# Patient Record
Sex: Male | Born: 1990 | Race: Black or African American | Hispanic: No | Marital: Married | Smoking: Current every day smoker
Health system: Southern US, Community
[De-identification: ages and names within clinical notes are randomized; demographics above are authoritative.]

---
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
## 2009-04-13 ENCOUNTER — Emergency Department (HOSPITAL_COMMUNITY): Admission: EM | Admit: 2009-04-13 | Discharge: 2009-04-13 | Payer: Self-pay | Admitting: Emergency Medicine

## 2012-05-19 ENCOUNTER — Encounter (HOSPITAL_COMMUNITY): Payer: Self-pay | Admitting: *Deleted

## 2012-05-19 ENCOUNTER — Emergency Department (HOSPITAL_COMMUNITY)
Admission: EM | Admit: 2012-05-19 | Discharge: 2012-05-19 | Disposition: A | Discharge status: Home / Self Care | Payer: Self-pay | Attending: Emergency Medicine | Admitting: Emergency Medicine

## 2012-05-19 DIAGNOSIS — F17200 Nicotine dependence, unspecified, uncomplicated: Secondary | ICD-10-CM | POA: Insufficient documentation

## 2012-05-19 DIAGNOSIS — F4320 Adjustment disorder, unspecified: Secondary | ICD-10-CM | POA: Insufficient documentation

## 2012-05-19 LAB — CBC
HCT: 44.8 % (ref 39.0–52.0)
Hemoglobin: 15.1 g/dL (ref 13.0–17.0)
MCH: 25.5 pg — ABNORMAL LOW (ref 26.0–34.0)
MCHC: 33.7 g/dL (ref 30.0–36.0)
RDW: 15.2 % (ref 11.5–15.5)

## 2012-05-19 LAB — COMPREHENSIVE METABOLIC PANEL
Albumin: 4.7 g/dL (ref 3.5–5.2)
BUN: 7 mg/dL (ref 6–23)
Calcium: 9.9 mg/dL (ref 8.4–10.5)
Creatinine, Ser: 0.95 mg/dL (ref 0.50–1.35)
GFR calc Af Amer: >90 mL/min (ref >90)
Glucose, Bld: 92 mg/dL (ref 70–99)
Total Protein: 8 g/dL (ref 6.0–8.3)

## 2012-05-19 LAB — RAPID URINE DRUG SCREEN, HOSP PERFORMED
Benzodiazepines: NOT DETECTED
Cocaine: NOT DETECTED
Opiates: NOT DETECTED

## 2012-05-19 LAB — ETHANOL: Alcohol, Ethyl (B): <11 mg/dL (ref 0–11)

## 2012-05-19 MED ORDER — NICOTINE 21 MG/24HR TD PT24
21.0000 mg | MEDICATED_PATCH | Freq: Every day | TRANSDERMAL | Status: DC
  Administered 2012-05-19: 21 mg via TRANSDERMAL
  Filled 2012-05-19: qty 1

## 2012-05-19 MED ORDER — IBUPROFEN 600 MG PO TABS: 600.0000 mg | ORAL_TABLET | Freq: Three times a day (TID) | ORAL | Status: DC | PRN

## 2012-05-19 MED ORDER — ACETAMINOPHEN 325 MG PO TABS: 650.0000 mg | ORAL_TABLET | ORAL | Status: DC | PRN

## 2012-05-19 MED ORDER — ONDANSETRON HCL 4 MG PO TABS: 4.0000 mg | ORAL_TABLET | Freq: Three times a day (TID) | ORAL | Status: DC | PRN

## 2012-05-19 MED ORDER — ALUM & MAG HYDROXIDE-SIMETH 200-200-20 MG/5ML PO SUSP: 30.0000 mL | ORAL | Status: DC | PRN

## 2012-05-19 NOTE — ED Notes (Signed)
Pt. Oriented to psych ed.  Pt. Calmly talking on phone at present.

## 2012-05-19 NOTE — ED Notes (Addendum)
Pt is fasting, no meals given

## 2012-05-19 NOTE — ED Provider Notes (Signed)
History     CSN: [id number]  Arrival date & time 05/19/12  1453   First MD Initiated Contact with Patient 05/19/12 1540      Chief Complaint  Patient presents with  . Medical Clearance    (Consider location/radiation/quality/duration/timing/severity/associated sxs/prior treatment) HPI  Patient presents to emergency department by [employer] with IVC paperwork from his parole officer's office with concern from [facility] and the parole officer that the patient made multiple statements of suicidal ideation. Initially, [employer] was called to the patient's house by himself with the call log noting that patient may claims that he had a lot going on at home and that he had another legal dispute and that he was upset about not wanting to go back to general and endocrine per the call log that he would jump in front of a car." When [employer] arrived on scene they state that patient was more calm and cooperative and they took him voluntarily to [facility] to be evaluated. Patient quickly left [facility] with the nurse who evaluated stating that she did not believe that he actually would act on any of his claims per the police report. Shortly after the patient voluntarily left [facility] the police were then called to the parole officers office with him stating that they had a man in restraints. Please that had been called first to the patient's home arrived on scene and found the patient in restraints by the parole officer. The parole officer reports that A. she became very aggressive and angry about his additional legal concerns as well as court dates and once again may claims that he did not go back to general and that he would hurt himself to prevent going back. Patient is a difficult historian because he denies these allegations is very elusive in explaining what happened. He denies suicidal or homicidal ideation. He states "I may have said that once but I didn't mean it." He has no physical complaint. Patient states his no known medical  problems and takes no medicines on regular basis. He denies any history of psychiatric complaints or psychiatric hospitalizations. History reviewed. No pertinent past medical history.  History reviewed. No pertinent past surgical history.  No family history on file.  History  Substance Use Topics  . Smoking status: Current Everyday Smoker  . Smokeless tobacco: Not on file  . Alcohol Use: Yes     1can beer/day      Review of Systems  All other systems reviewed and are negative.    Allergies  Review of patient's allergies indicates no known allergies.  Home Medications  No current outpatient prescriptions on file.  BP 124/63  Pulse 77  Temp 98 F (36.7 C) (Oral)  Resp 16  SpO2 99%  Physical Exam  Nursing note and vitals reviewed. Constitutional: He is oriented to person, place, and time. He appears well-developed and well-nourished. No distress.  HENT:  Head: Normocephalic and atraumatic.  Eyes: Conjunctivae are normal.  Neck: Normal range of motion. Neck supple.  Cardiovascular: Normal rate, regular rhythm, normal heart sounds and intact distal pulses.  Exam reveals no gallop and no friction rub.   No murmur heard. Pulmonary/Chest: Effort normal and breath sounds normal. No respiratory distress. He has no wheezes. He has no rales. He exhibits no tenderness.  Abdominal: Bowel sounds are normal. He exhibits no distension and no mass. There is no tenderness. There is no rebound and no guarding.  Musculoskeletal: Normal range of motion. He exhibits no edema and no tenderness.  Neurological: He is alert and oriented to person, place, and time.  Skin: Skin is warm and dry. No rash noted. He is not diaphoretic. No erythema.  Psychiatric: He has a normal mood and affect. His speech is normal and behavior is normal. Judgment and thought content normal. He expresses no homicidal and no suicidal ideation. He expresses no suicidal plans and no homicidal plans.    ED Course    Procedures (including critical care time)  Spoke with ACT who will evaluate patient after Telepsych recommendations.   I believe that patient's outbursts were likely out of anger though numerous sources have now reported some type of SI statement about "not going back to jail". Though I believe he will not act on these and he has denied SI or HI throughout ER stay, I will consult telepsych.   Temp psych holding orders written.   Labs Reviewed  CBC - Abnormal; Notable for the following:    RBC 5.92 (*)     MCV 75.7 (*)     MCH 25.5 (*)     All other components within normal limits  COMPREHENSIVE METABOLIC PANEL - Abnormal; Notable for the following:    Potassium 3.4 (*)     All other components within normal limits  URINE RAPID DRUG SCREEN (HOSP PERFORMED) - Abnormal; Notable for the following:    Tetrahydrocannabinol POSITIVE (*)     All other components within normal limits  ETHANOL   No results found.   1. Adjustment disorder       MDM  dispo pending tele psych consult.     Tele psych consult in chart. They rescended IVC stating patient was impulsive with adjustment disorder but no concern for active SI and safe to d/c home.     [place], [place] 05/19/12 [address] [place], [place] 05/19/12 2156

## 2012-05-19 NOTE — Progress Notes (Signed)
Pt was seen by the tele-psychiatrist who recommends discharge home with outpatient resources. Tele-psych also reversed the pt's IVC papers. ED PA notified and is in agreement with disposition. RN made aware. CSW met with the pt to provide information on outpatient resources including [facility] LME, mobile crisis, and local psychiatrists/therapists. Pt was able to contract for safety.  No further needs were identified at this time.

## 2012-05-19 NOTE — ED Notes (Signed)
Patient discharge to home with written and verbal instructions with a steady gait. Respirations equal and unlabored, Skin warm and dry. No acute distress noted.

## 2012-05-19 NOTE — ED Notes (Signed)
Telepsych consult ordered. 

## 2012-05-19 NOTE — ED Notes (Signed)
Pt in by [employer]. Per IVC papers, pt repeatedly told probation officer that he was suicidal today. Was aggressive towards probation staff. When [employer] called, pt had to be physically restrained. Pt arrives to ED in handcuffs. Pt denying SI/HI. Vague and noncompliant with questioning. Responds with "sometimes" to some questions and mumbling then answering "nevermind" when asked again. Pt denies med or psych Hx. Denies meds.

## 2012-05-19 NOTE — ED Notes (Signed)
Pt states he received a ticket today and went to his probation officer today to let her know.  While there she advised him that if he were found guilty he would have to pay fines and have his probation extended.  Pt then said that he would not pay fines or have his probation extended, he would just do something else.  When asked, he denied SI/HI.  Stated he was angry when talking to her.  Pleasant during assessment.

## 2012-05-20 NOTE — ED Provider Notes (Signed)
Medical screening examination/treatment/procedure(s) were performed by non-physician practitioner and as supervising physician I was immediately available for consultation/collaboration.  Cyndra Numbers, MD 05/20/12 [phone]

## 2019-08-03 DIAGNOSIS — N179 Acute kidney failure, unspecified: Secondary | ICD-10-CM | POA: Insufficient documentation

## 2019-08-03 DIAGNOSIS — Z20828 Contact with and (suspected) exposure to other viral communicable diseases: Secondary | ICD-10-CM | POA: Insufficient documentation

## 2019-08-03 DIAGNOSIS — E872 Acidosis: Secondary | ICD-10-CM | POA: Insufficient documentation

## 2019-08-03 DIAGNOSIS — R451 Restlessness and agitation: Secondary | ICD-10-CM | POA: Insufficient documentation

## 2019-08-03 DIAGNOSIS — F17200 Nicotine dependence, unspecified, uncomplicated: Secondary | ICD-10-CM | POA: Insufficient documentation

## 2019-08-03 DIAGNOSIS — E876 Hypokalemia: Secondary | ICD-10-CM | POA: Insufficient documentation

## 2019-08-03 DIAGNOSIS — M6282 Rhabdomyolysis: Principal | ICD-10-CM | POA: Insufficient documentation

## 2019-08-03 DIAGNOSIS — Z5321 Procedure and treatment not carried out due to patient leaving prior to being seen by health care provider: Secondary | ICD-10-CM | POA: Insufficient documentation

## 2019-08-03 DIAGNOSIS — F10129 Alcohol abuse with intoxication, unspecified: Secondary | ICD-10-CM | POA: Insufficient documentation

## 2019-08-04 ENCOUNTER — Observation Stay (HOSPITAL_COMMUNITY)
Admission: EM | Admit: 2019-08-04 | Discharge: 2019-08-04 | Discharge status: Against Medical Advice | Payer: Self-pay | Attending: Internal Medicine | Admitting: Internal Medicine

## 2019-08-04 ENCOUNTER — Emergency Department (HOSPITAL_COMMUNITY): Payer: Self-pay

## 2019-08-04 ENCOUNTER — Other Ambulatory Visit: Payer: Self-pay

## 2019-08-04 DIAGNOSIS — E872 Acidosis, unspecified: Secondary | ICD-10-CM | POA: Diagnosis present

## 2019-08-04 DIAGNOSIS — F10920 Alcohol use, unspecified with intoxication, uncomplicated: Secondary | ICD-10-CM

## 2019-08-04 DIAGNOSIS — M6282 Rhabdomyolysis: Secondary | ICD-10-CM

## 2019-08-04 DIAGNOSIS — F10929 Alcohol use, unspecified with intoxication, unspecified: Secondary | ICD-10-CM | POA: Diagnosis present

## 2019-08-04 DIAGNOSIS — R4689 Other symptoms and signs involving appearance and behavior: Secondary | ICD-10-CM

## 2019-08-04 DIAGNOSIS — E876 Hypokalemia: Secondary | ICD-10-CM | POA: Diagnosis present

## 2019-08-04 LAB — COMPREHENSIVE METABOLIC PANEL
ALT: 30 U/L (ref 0–44)
AST: 39 U/L (ref 15–41)
Albumin: 4.9 g/dL (ref 3.5–5.0)
Alkaline Phosphatase: 75 U/L (ref 38–126)
Anion gap: 19 — ABNORMAL HIGH (ref 5–15)
BUN: 14 mg/dL (ref 6–20)
CO2: 18 mmol/L — ABNORMAL LOW (ref 22–32)
Calcium: 8.9 mg/dL (ref 8.9–10.3)
Chloride: 103 mmol/L (ref 98–111)
Creatinine, Ser: 1.38 mg/dL — ABNORMAL HIGH (ref 0.61–1.24)
GFR calc Af Amer: >60 mL/min (ref >60)
GFR calc non Af Amer: >60 mL/min (ref >60)
Glucose, Bld: 91 mg/dL (ref 70–99)
Potassium: 3.4 mmol/L — ABNORMAL LOW (ref 3.5–5.1)
Sodium: 140 mmol/L (ref 135–145)
Total Bilirubin: 0.6 mg/dL (ref 0.3–1.2)
Total Protein: 8 g/dL (ref 6.5–8.1)

## 2019-08-04 LAB — CBC
HCT: 44.1 % (ref 39.0–52.0)
Hemoglobin: 14.1 g/dL (ref 13.0–17.0)
MCH: 25.7 pg — ABNORMAL LOW (ref 26.0–34.0)
MCHC: 32 g/dL (ref 30.0–36.0)
MCV: 80.3 fL (ref 80.0–100.0)
Platelets: 301 10*3/uL (ref 150–400)
RBC: 5.49 MIL/uL (ref 4.22–5.81)
RDW: 15.3 % (ref 11.5–15.5)
WBC: 7.7 10*3/uL (ref 4.0–10.5)
nRBC: 0 % (ref 0.0–0.2)

## 2019-08-04 LAB — RAPID URINE DRUG SCREEN, HOSP PERFORMED
Amphetamines: NOT DETECTED
Barbiturates: NOT DETECTED
Benzodiazepines: POSITIVE — AB
Cocaine: NOT DETECTED
Opiates: NOT DETECTED
Tetrahydrocannabinol: POSITIVE — AB

## 2019-08-04 LAB — URINALYSIS, COMPLETE (UACMP) WITH MICROSCOPIC
Bilirubin Urine: NEGATIVE
Glucose, UA: NEGATIVE mg/dL
Ketones, ur: NEGATIVE mg/dL
Leukocytes,Ua: NEGATIVE
Nitrite: NEGATIVE
Protein, ur: 100 mg/dL — AB
Specific Gravity, Urine: 1.01 (ref 1.005–1.030)
pH: 6 (ref 5.0–8.0)

## 2019-08-04 LAB — ACETAMINOPHEN LEVEL: Acetaminophen (Tylenol), Serum: <10 ug/mL — ABNORMAL LOW (ref 10–30)

## 2019-08-04 LAB — CK
Total CK: 1421 U/L — ABNORMAL HIGH (ref 49–397)
Total CK: 3998 U/L — ABNORMAL HIGH (ref 49–397)

## 2019-08-04 LAB — ETHANOL: Alcohol, Ethyl (B): 211 mg/dL — ABNORMAL HIGH (ref <10)

## 2019-08-04 LAB — SALICYLATE LEVEL: Salicylate Lvl: <7 mg/dL (ref 2.8–30.0)

## 2019-08-04 LAB — SARS CORONAVIRUS 2 (TAT 6-24 HRS): SARS Coronavirus 2: NEGATIVE

## 2019-08-04 LAB — CBG MONITORING, ED: Glucose-Capillary: 89 mg/dL (ref 70–99)

## 2019-08-04 MED ORDER — HEPARIN SODIUM (PORCINE) 5000 UNIT/ML IJ SOLN: 5000.0000 [IU] | Freq: Three times a day (TID) | INTRAMUSCULAR | Status: DC

## 2019-08-04 MED ORDER — ZOLPIDEM TARTRATE 5 MG PO TABS: 5.0000 mg | ORAL_TABLET | Freq: Every evening | ORAL | Status: DC | PRN

## 2019-08-04 MED ORDER — SODIUM CHLORIDE 0.9 % IV BOLUS (SEPSIS)
1000.0000 mL | Freq: Once | INTRAVENOUS | Status: AC
  Administered 2019-08-04: 1000 mL via INTRAVENOUS

## 2019-08-04 MED ORDER — ACETAMINOPHEN 325 MG PO TABS: 650.0000 mg | ORAL_TABLET | Freq: Four times a day (QID) | ORAL | Status: DC | PRN

## 2019-08-04 MED ORDER — SENNOSIDES-DOCUSATE SODIUM 8.6-50 MG PO TABS: 1.0000 | ORAL_TABLET | Freq: Every evening | ORAL | Status: DC | PRN

## 2019-08-04 MED ORDER — ONDANSETRON HCL 4 MG PO TABS: 4.0000 mg | ORAL_TABLET | Freq: Four times a day (QID) | ORAL | Status: DC | PRN

## 2019-08-04 MED ORDER — VITAMIN B-1 100 MG PO TABS: 100.0000 mg | ORAL_TABLET | Freq: Every day | ORAL | Status: DC

## 2019-08-04 MED ORDER — SODIUM CHLORIDE 0.9 % IV SOLN: INTRAVENOUS | Status: DC

## 2019-08-04 MED ORDER — LORAZEPAM 2 MG/ML IJ SOLN: 0.0000 mg | Freq: Two times a day (BID) | INTRAMUSCULAR | Status: DC

## 2019-08-04 MED ORDER — FOLIC ACID 1 MG PO TABS: 1.0000 mg | ORAL_TABLET | Freq: Every day | ORAL | Status: DC

## 2019-08-04 MED ORDER — LORAZEPAM 2 MG/ML IJ SOLN: 0.0000 mg | Freq: Four times a day (QID) | INTRAMUSCULAR | Status: DC

## 2019-08-04 MED ORDER — LACTATED RINGERS IV BOLUS: 1000.0000 mL | Freq: Once | INTRAVENOUS | Status: DC

## 2019-08-04 MED ORDER — HALOPERIDOL LACTATE 5 MG/ML IJ SOLN: 2.0000 mg | Freq: Four times a day (QID) | INTRAMUSCULAR | Status: DC | PRN

## 2019-08-04 MED ORDER — LORAZEPAM 2 MG/ML IJ SOLN: 1.0000 mg | INTRAMUSCULAR | Status: DC | PRN

## 2019-08-04 MED ORDER — LORAZEPAM 1 MG PO TABS: 1.0000 mg | ORAL_TABLET | ORAL | Status: DC | PRN

## 2019-08-04 MED ORDER — SODIUM CHLORIDE 0.9 % IV SOLN
INTRAVENOUS | Status: DC
  Administered 2019-08-04: 05:00:00 via INTRAVENOUS

## 2019-08-04 MED ORDER — ACETAMINOPHEN 650 MG RE SUPP: 650.0000 mg | Freq: Four times a day (QID) | RECTAL | Status: DC | PRN

## 2019-08-04 MED ORDER — SODIUM CHLORIDE 0.9% FLUSH: 3.0000 mL | Freq: Two times a day (BID) | INTRAVENOUS | Status: DC

## 2019-08-04 MED ORDER — POTASSIUM CHLORIDE CRYS ER 20 MEQ PO TBCR: 40.0000 meq | EXTENDED_RELEASE_TABLET | Freq: Once | ORAL | Status: DC

## 2019-08-04 MED ORDER — TETANUS-DIPHTH-ACELL PERTUSSIS 5-2.5-18.5 LF-MCG/0.5 IM SUSP
0.5000 mL | Freq: Once | INTRAMUSCULAR | Status: AC
  Administered 2019-08-04: 0.5 mL via INTRAMUSCULAR
  Filled 2019-08-04: qty 0.5

## 2019-08-04 MED ORDER — THIAMINE HCL 100 MG/ML IJ SOLN: 100.0000 mg | Freq: Every day | INTRAMUSCULAR | Status: DC

## 2019-08-04 MED ORDER — ONDANSETRON HCL 4 MG/2ML IJ SOLN: 4.0000 mg | Freq: Four times a day (QID) | INTRAMUSCULAR | Status: DC | PRN

## 2019-08-04 MED ORDER — ADULT MULTIVITAMIN W/MINERALS CH: 1.0000 | ORAL_TABLET | Freq: Every day | ORAL | Status: DC

## 2019-08-04 NOTE — H&P (Signed)
[facility] History and Physical   Patient: Allen Young BZJ:[id number]   PCP: Patient, No Pcp Per DOB: Nov 24, 1990   DOA: 08/04/2019   DOS: 08/04/2019   DOS: the patient was seen and examined on 08/04/2019  Patient coming from: The patient is coming from Home  Chief Complaint: Confusion and agitation  HPI: Clayborn Milnes is a 28 y.o. male with Past medical history of alcohol abuse. Brought in by EMS and [employer] p.  Initially patient called 911 for assistance without any information.  When GPT arrived at the scene they found the patient was agitated and was fighting with police.  Patient was intoxicated and was arrested for agitation.  When the patient was placed in the police car instead of hitting the window with his head.  EMS was called.  Patient received Versed and Haldol and was brought to the emergency department for further work-up and evaluation. At the time of my evaluation patient denied any complaints of chest pain, abdominal pain, nausea, vomiting.  No headache.  No dizziness no focal deficit.  No leg pain no arm pain.  No diarrhea no constipation. He denies any drug abuse. Unable to tell me regarding his alcohol use.  ED Course: After arrival to EMS initial lab work was showing acute kidney injury and elevated CK.  Patient was referred for admission.  At his baseline ambulates without assistance  Review of Systems: as mentioned in the history of present illness.  All other systems reviewed and are negative.  No past medical history on file. No past surgical history on file. Social History:  reports that he has been smoking. He does not have any smokeless tobacco history on file. He reports current alcohol use. He reports current drug use. Drug: Marijuana.  No Known Allergies  Family history reviewed and not pertinent No family history on file.   Prior to Admission medications   Not on File    Physical Exam: Vitals:   08/04/19 0301 08/04/19 0330 08/04/19 0537  08/04/19 0630  BP: (!) 116/56 117/68 103/62 115/63  Pulse: 77 89  74  Resp:    13  Temp:      SpO2:  100%  98%    General: alert and oriented to time, place, and person. Appear in mild distress, affect anxious Eyes: PERRL, Conjunctiva normal ENT: Oral Mucosa Clear, moist  Neck: no JVD, no Abnormal Mass Or lumps Cardiovascular: S1 and S2 Present, no Murmur, peripheral pulses symmetrical Respiratory: good respiratory effort, Bilateral Air entry equal and Decreased, no signs of accessory muscle use, Clear to Auscultation, no Crackles, no wheezes Abdomen: Bowel Sound present, Soft and no tenderness, no hernia Skin: no rashes  Extremities: no Pedal edema, no calf tenderness Neurologic: without any new focal findings Gait not checked due to patient safety concerns  Data Reviewed: I have personally reviewed and interpreted labs, imaging as discussed below.  CBC: Recent Labs  Lab 08/04/19 0017  WBC 7.7  HGB 14.1  HCT 44.1  MCV 80.3  PLT 301   Basic Metabolic Panel: Recent Labs  Lab 08/04/19 0017  NA 140  K 3.4*  CL 103  CO2 18*  GLUCOSE 91  BUN 14  CREATININE 1.38*  CALCIUM 8.9   GFR: CrCl cannot be calculated (Unknown ideal weight.). Liver Function Tests: Recent Labs  Lab 08/04/19 0017  AST 39  ALT 30  ALKPHOS 75  BILITOT 0.6  PROT 8.0  ALBUMIN 4.9   No results for input(s): LIPASE, AMYLASE in the last  168 hours. No results for input(s): AMMONIA in the last 168 hours. Coagulation Profile: No results for input(s): INR, PROTIME in the last 168 hours. Cardiac Enzymes: Recent Labs  Lab 08/04/19 0017 08/04/19 0359  CKTOTAL 1,421* 3,998*   BNP (last 3 results) No results for input(s): PROBNP in the last 8760 hours. HbA1C: No results for input(s): HGBA1C in the last 72 hours. CBG: Recent Labs  Lab 08/04/19 0104  GLUCAP 89   Lipid Profile: No results for input(s): CHOL, HDL, LDLCALC, TRIG, CHOLHDL, LDLDIRECT in the last 72 hours. Thyroid Function  Tests: No results for input(s): TSH, T4TOTAL, FREET4, T3FREE, THYROIDAB in the last 72 hours. Anemia Panel: No results for input(s): VITAMINB12, FOLATE, FERRITIN, TIBC, IRON, RETICCTPCT in the last 72 hours. Urine analysis:    Component Value Date/Time   COLORURINE YELLOW 08/04/2019 [place] 08/04/2019 0047   LABSPEC 1.010 08/04/2019 0047   PHURINE 6.0 08/04/2019 0047   GLUCOSEU NEGATIVE 08/04/2019 0047   HGBUR MODERATE (A) 08/04/2019 0047   BILIRUBINUR NEGATIVE 08/04/2019 0047   [place] 08/04/2019 0047   PROTEINUR 100 (A) 08/04/2019 0047   NITRITE NEGATIVE 08/04/2019 0047   LEUKOCYTESUR NEGATIVE 08/04/2019 0047    Radiological Exams on Admission: Ct Head Wo Contrast  Result Date: 08/04/2019 CLINICAL DATA:  Headache posttraumatic EXAM: CT HEAD WITHOUT CONTRAST CT CERVICAL SPINE WITHOUT CONTRAST TECHNIQUE: Multidetector CT imaging of the head and cervical spine was performed following the standard protocol without intravenous contrast. Multiplanar CT image reconstructions of the cervical spine were also generated. COMPARISON:  None. FINDINGS: CT HEAD FINDINGS Brain: No evidence of acute infarction, hemorrhage, hydrocephalus, extra-axial collection or mass lesion/mass effect. Vascular: No hyperdense vessel or unexpected calcification. Skull: Normal. Negative for fracture or focal lesion. Sinuses/Orbits: Mucosal thickening in the maxillary and ethmoid sinuses Other: None CT CERVICAL SPINE FINDINGS Alignment: Mild straightening. No subluxation. Facet alignment within normal limits. Skull base and vertebrae: No acute fracture. No primary bone lesion or focal pathologic process. Soft tissues and spinal canal: No prevertebral fluid or swelling. No visible canal hematoma. Disc levels:  Mild degenerative changes at C6-C7. Upper chest: Negative. Other: None IMPRESSION: 1. Negative non contrasted CT appearance of the brain. 2. Straightening of the cervical spine. No acute  osseous abnormality Electronically Signed   By: Donavan Foil M.D.   On: 08/04/2019 02:50   Ct Cervical Spine Wo Contrast  Result Date: 08/04/2019 CLINICAL DATA:  Headache posttraumatic EXAM: CT HEAD WITHOUT CONTRAST CT CERVICAL SPINE WITHOUT CONTRAST TECHNIQUE: Multidetector CT imaging of the head and cervical spine was performed following the standard protocol without intravenous contrast. Multiplanar CT image reconstructions of the cervical spine were also generated. COMPARISON:  None. FINDINGS: CT HEAD FINDINGS Brain: No evidence of acute infarction, hemorrhage, hydrocephalus, extra-axial collection or mass lesion/mass effect. Vascular: No hyperdense vessel or unexpected calcification. Skull: Normal. Negative for fracture or focal lesion. Sinuses/Orbits: Mucosal thickening in the maxillary and ethmoid sinuses Other: None CT CERVICAL SPINE FINDINGS Alignment: Mild straightening. No subluxation. Facet alignment within normal limits. Skull base and vertebrae: No acute fracture. No primary bone lesion or focal pathologic process. Soft tissues and spinal canal: No prevertebral fluid or swelling. No visible canal hematoma. Disc levels:  Mild degenerative changes at C6-C7. Upper chest: Negative. Other: None IMPRESSION: 1. Negative non contrasted CT appearance of the brain. 2. Straightening of the cervical spine. No acute osseous abnormality Electronically Signed   By: Donavan Foil M.D.   On: 08/04/2019 02:50   EKG:  Independently reviewed. normal sinus rhythm, nonspecific ST and T waves changes.  I reviewed all nursing notes, pharmacy notes, vitals, pertinent old records.  Assessment/Plan 1. Rhabdomyolysis Presents with agitation. CK was 1400 on admission trending up to 3400. Acute kidney injury with serum creatinine of 1.36. UDS positive for benzo which the patient received with EMS as well as marijuana. Alcohol level was 211 but Tylenol and salicylate levels were negative. Patient was given 3 L of  IV fluid bolus in the ER and was referred for admission secondary to uptrend of the CK despite that. We will continue with IV fluids. Repeat CK tomorrow morning.  2.  Hypokalemia Metabolic acidosis Anion gap elevated. Likely secondary to alcohol induced ketosis but lactic acidosis also cannot be ruled out. We will check lactic acid level. Continue with aggressive IV hydration. Potassium replaced orally. Monitor on telemetry.  3.  Alcohol intoxication. Unable to tell me how much patient is drinking on a regular basis. Currently we will use MedSurg CIWA protocol.   Nutrition: Regular diet DVT Prophylaxis: Subcutaneous Lovenox  Advance goals of care discussion: Full code   Consults: none   Family Communication: no family was present at bedside, at the time of interview.  Disposition: Admitted as observation, telemetry unit. Likely to be discharged home, in 1-2 days.  I have discussed plan of care as described above with RN and patient/family.  Severity of Illness: The appropriate patient status for this patient is OBSERVATION. Observation status is judged to be reasonable and necessary in order to provide the required intensity of service to ensure the patient's safety. The patient's presenting symptoms, physical exam findings, and initial radiographic and laboratory data in the context of their medical condition is felt to place them at decreased risk for further clinical deterioration. Furthermore, it is anticipated that the patient will be medically stable for discharge from the hospital within 2 midnights of admission. The following factors support the patient status of observation.   " The patient's presenting symptoms include confusion and intoxication. " The physical exam findings include agitation and anxiety. " The initial radiographic and laboratory data are elevated CK with uptrend.     Author: Lynden OxfordPranav Nadiyah Zeis, MD [facility] 08/04/2019 9:45 AM   To reach  On-call, see care teams to locate the attending and reach out to them via [email or web address]. If 7PM-7AM, please contact night-coverage If you still have difficulty reaching the attending provider, please page the [facility] (Director on Call) for [facility] on amion for assistance.

## 2019-08-04 NOTE — Progress Notes (Signed)
Patient stating that he wanted to leave ama.  Dr. Posey Pronto notified, came by discussed the risk of leaving including the possibility of dialysis and death.  Patient still adamant on leaving [facility]a.  Patient able to answer orientation questions appropriately and was notified on multiple occasions that he was going to be taken under arrest as soon as he left.  Patient signed ama paperwork and was handcuffed by the police voluntarily without fighting.

## 2019-08-04 NOTE — Progress Notes (Signed)
Patient is under a disclosure agreement with the [facility] police department.  When patient leaves notify security so that someone can be sent to arrest the patient.

## 2019-08-04 NOTE — Discharge Summary (Signed)
[facility] Discharge Summary   Patient: Allen Young JXB:[id number]   PCP: Patient, No Pcp Per DOB: Apr 05, 1991   Date of admission: 08/04/2019   Date of discharge: 08/04/2019    Discharge Diagnoses: patient left AMA Principal Problem:   Rhabdomyolysis Active Problems:   Alcoholic intoxication with complication (HCC)   Hypokalemia   Metabolic acidosis   Discharge Condition: stable  History of present illness: As per the H and P dictated on admission, "Hazem Kenner is a 28 y.o. male with Past medical history of alcohol abuse. Brought in by EMS and [employer] p.  Initially patient called 911 for assistance without any information.  When GPT arrived at the scene they found the patient was agitated and was fighting with police.  Patient was intoxicated and was arrested for agitation.  When the patient was placed in the police car instead of hitting the window with his head.  EMS was called.  Patient received Versed and Haldol and was brought to the emergency department for further work-up and evaluation. At the time of my evaluation patient denied any complaints of chest pain, abdominal pain, nausea, vomiting.  No headache.  No dizziness no focal deficit.  No leg pain no arm pain.  No diarrhea no constipation. He denies any drug abuse. Unable to tell me regarding his alcohol use."  Hospital Course:  Acute kidney injury with rhabdomyolysis. Secondary to agitation. Plan was to Continue with aggressive IV hydration. Repeat the CK and ensure stability of the labs prior to discharge.   Called by the RN with the patient wants to leave AMA. Explained to the patient the risk for leaving the hospital before medical treatment is completed including but not limited to worsening of renal failure with eventual hemodialysis and death. Patient understand the risk. Also explained that the patient the outcome after leaving the hospital with most likely be under with police custody and he still wants to  leave the hospital. [employer] called. Patient signed AMA papers. patient left AMA  Procedures and Results:  none   Consultations:  none  The results of significant diagnostics from this hospitalization (including imaging, microbiology, ancillary and laboratory) are listed below for reference.    Significant Diagnostic Studies: Ct Head Wo Contrast  Result Date: 08/04/2019 CLINICAL DATA:  Headache posttraumatic EXAM: CT HEAD WITHOUT CONTRAST CT CERVICAL SPINE WITHOUT CONTRAST TECHNIQUE: Multidetector CT imaging of the head and cervical spine was performed following the standard protocol without intravenous contrast. Multiplanar CT image reconstructions of the cervical spine were also generated. COMPARISON:  None. FINDINGS: CT HEAD FINDINGS Brain: No evidence of acute infarction, hemorrhage, hydrocephalus, extra-axial collection or mass lesion/mass effect. Vascular: No hyperdense vessel or unexpected calcification. Skull: Normal. Negative for fracture or focal lesion. Sinuses/Orbits: Mucosal thickening in the maxillary and ethmoid sinuses Other: None CT CERVICAL SPINE FINDINGS Alignment: Mild straightening. No subluxation. Facet alignment within normal limits. Skull base and vertebrae: No acute fracture. No primary bone lesion or focal pathologic process. Soft tissues and spinal canal: No prevertebral fluid or swelling. No visible canal hematoma. Disc levels:  Mild degenerative changes at C6-C7. Upper chest: Negative. Other: None IMPRESSION: 1. Negative non contrasted CT appearance of the brain. 2. Straightening of the cervical spine. No acute osseous abnormality Electronically Signed   By: Jasmine Pang M.D.   On: 08/04/2019 02:50   Ct Cervical Spine Wo Contrast  Result Date: 08/04/2019 CLINICAL DATA:  Headache posttraumatic EXAM: CT HEAD WITHOUT CONTRAST CT CERVICAL SPINE WITHOUT CONTRAST TECHNIQUE: Multidetector CT imaging  of the head and cervical spine was performed following the standard protocol  without intravenous contrast. Multiplanar CT image reconstructions of the cervical spine were also generated. COMPARISON:  None. FINDINGS: CT HEAD FINDINGS Brain: No evidence of acute infarction, hemorrhage, hydrocephalus, extra-axial collection or mass lesion/mass effect. Vascular: No hyperdense vessel or unexpected calcification. Skull: Normal. Negative for fracture or focal lesion. Sinuses/Orbits: Mucosal thickening in the maxillary and ethmoid sinuses Other: None CT CERVICAL SPINE FINDINGS Alignment: Mild straightening. No subluxation. Facet alignment within normal limits. Skull base and vertebrae: No acute fracture. No primary bone lesion or focal pathologic process. Soft tissues and spinal canal: No prevertebral fluid or swelling. No visible canal hematoma. Disc levels:  Mild degenerative changes at C6-C7. Upper chest: Negative. Other: None IMPRESSION: 1. Negative non contrasted CT appearance of the brain. 2. Straightening of the cervical spine. No acute osseous abnormality Electronically Signed   By: Donavan Foil M.D.   On: 08/04/2019 02:50    Microbiology: No results found for this or any previous visit (from the past 240 hour(s)).   Labs: CBC: Recent Labs  Lab 08/04/19 0017  WBC 7.7  HGB 14.1  HCT 44.1  MCV 80.3  PLT 740   Basic Metabolic Panel: Recent Labs  Lab 08/04/19 0017  NA 140  K 3.4*  CL 103  CO2 18*  GLUCOSE 91  BUN 14  CREATININE 1.38*  CALCIUM 8.9   Liver Function Tests: Recent Labs  Lab 08/04/19 0017  AST 39  ALT 30  ALKPHOS 75  BILITOT 0.6  PROT 8.0  ALBUMIN 4.9   No results for input(s): LIPASE, AMYLASE in the last 168 hours. No results for input(s): AMMONIA in the last 168 hours. Cardiac Enzymes: Recent Labs  Lab 08/04/19 0017 08/04/19 0359  CKTOTAL 1,421* 3,998*   BNP (last 3 results) No results for input(s): BNP in the last 8760 hours. CBG: Recent Labs  Lab 08/04/19 0104  GLUCAP 89   Time spent: 20 minutes  Signed:  Berle Mull   [facility] 08/04/2019, 10:08 AM

## 2019-08-04 NOTE — ED Provider Notes (Addendum)
TIME SEEN: 12:15 AM  CHIEF COMPLAINT: Alcohol intoxication, combative behavior  HPI: Patient is a 28 year old male with unknown past medical history who presents to the emergency department via EMS for alcohol intoxication and combative behavior.  Reportedly patient was intoxicated tonight and called 911.  It is unclear why he called 911.  Police arrived at the scene and he became combative with police.  They put him in the back of the squad car he began to strike his head on the window.  EMS was called.  EMS gave patient 2.5 mg of IM Versed and 5 mg of IM Haldol.  Blood sugar was normal with EMS.  Normal vital signs in route.  ROS: Level 5 caveat secondary to sedation  PAST MEDICAL HISTORY/PAST SURGICAL HISTORY:  No past medical history on file.  MEDICATIONS:  Prior to Admission medications   Not on File    ALLERGIES:  No Known Allergies  SOCIAL HISTORY:  Social History   Tobacco Use  . Smoking status: Current Every Day Smoker  Substance Use Topics  . Alcohol use: Yes    Comment: 1can beer/day    FAMILY HISTORY: No family history on file.  EXAM: BP 128/60   Pulse 90   Temp 97.9 F (36.6 C)   Resp 17   SpO2 95%  CONSTITUTIONAL: Alert and will open eyes and curse at staff and then fall back asleep.  Does not answer questions or follow commands.  Moves all extremities equally.  Intermittently trying to hit staff. HEAD: Normocephalic, atraumatic EYES: Conjunctivae clear, pupils appear equal, EOMI ENT: normal nose; moist mucous membranes NECK: Supple, no meningismus, no nuchal rigidity, no LAD  CARD: RRR; S1 and S2 appreciated; no murmurs, no clicks, no rubs, no gallops RESP: Normal chest excursion without splinting or tachypnea; breath sounds clear and equal bilaterally; no wheezes, no rhonchi, no rales, no hypoxia or respiratory distress, speaking full sentences ABD/GI: Normal bowel sounds; non-distended; soft, non-tender, no rebound, no guarding, no peritoneal signs, no  hepatosplenomegaly BACK:  The back appears normal and is non-tender to palpation, there is no CVA tenderness EXT: Normal ROM in all joints; non-tender to palpation; no edema; normal capillary refill; no cyanosis, no calf tenderness or swelling    SKIN: Normal color for age and race; warm; no rash, multiple abrasions to his bilateral lower extremities NEURO: Moves all extremities equally PSYCH: Patient is combative, cursing, trying to hit staff.  Unable to be redirected.  MEDICAL DECISION MAKING: Patient here intoxicated, combative.  Received sedation with EMS.  Currently in four-point restraints.  Will obtain screening labs, urine, CT head and cervical spine, EKG.  Blood glucose with EMS normal.  Patient be monitored until clinically sober and reassess to see if he needs psychiatric evaluation.  ED PROGRESS: 3:15 AM  Patient CT head and cervical spine show no acute injury.  He does have a CK level of 1400 with a slightly elevated creatinine of 1.38.  He is received 1 L of IV fluid.  Will give another 2 L IV fluid bolus and recheck CK level.  He is currently making urine.  Drug screen positive for benzodiazepines and THC.  Alcohol level is 211.  Tylenol and salicylate levels are negative.  Patient is still very drowsy but redirectable and restraints have been removed.  3:45 AM  Pt becoming increasingly combative and threatening to leave.  Placed under arrest by [facility].  I have explained to him at length my concerns for rhabdomyolysis and early  renal failure.  He is received 3 L of IV fluids and approximately half of a fourth liter.  He agrees to allow [place] to obtain a repeat CK level only after he has been allowed to urinate.  He is requesting the handcuffs to be removed.  Police at bedside.  Nursing staff at bedside to obtain repeat blood work.  At this time he is not medically cleared and if CK continues to rise, patient may need medical admission.  5:15 AM  Pt's CK level is now  almost 4000 despite 3-1/2 L of IV fluids.  He is urinating and his urine appears normal without being tea colored.  Will discuss with hospitalist for admission and continued IV hydration.  Suspect rhabdomyolysis is secondary to excited delirium from possible drug use.  Patient denies any stimulant use.  Drug screen only positive for benzodiazepines which she received with EMS and THC.   5:27 AM Discussed patient's case with hospitalist, Dr. Myna Hidalgo.  I have recommended admission and patient (and family if present) agree with this plan. Admitting physician will place admission orders.   Patient agrees with admission.  He is under arrest and police will be with him throughout his entire hospitalization.  I reviewed all nursing notes, vitals, pertinent previous records, EKGs, lab and urine results, imaging (as available).    Allen Young was evaluated in Emergency Department on 08/04/2019 for the symptoms described in the history of present illness. He was evaluated in the context of the global COVID-19 pandemic, which necessitated consideration that the patient might be at risk for infection with the SARS-CoV-2 virus that causes COVID-19. Institutional protocols and algorithms that pertain to the evaluation of patients at risk for COVID-19 are in a state of rapid change based on information released by regulatory bodies including the CDC and federal and state organizations. These policies and algorithms were followed during the patient's care in the ED.    EKG Interpretation  Date/Time:  Friday August 04 2019 00:48:11 EDT Ventricular Rate:  92 PR Interval:    QRS Duration: 88 QT Interval:  364 QTC Calculation: 451 R Axis:   15 Text Interpretation:  Sinus rhythm Right atrial enlargement LVH by voltage ST elev, probable normal early repol pattern No old tracing to compare Confirmed by Kasidi Shanker, Cyril Mourning [phone]) on 08/04/2019 12:54:09 AM         Lamija Besse, Delice Bison, DO 08/04/19 0527    Allen Young, Delice Bison, DO 08/04/19 0528    CRITICAL CARE Performed by: Cyril Mourning Mihira Tozzi   Total critical care time: 45 minutes  Critical care time was exclusive of separately billable procedures and treating other patients.  Critical care was necessary to treat or prevent imminent or life-threatening deterioration.  Critical care was time spent personally by me on the following activities: development of treatment plan with patient and/or surrogate as well as nursing, discussions with consultants, evaluation of patient's response to treatment, examination of patient, obtaining history from patient or surrogate, ordering and performing treatments and interventions, ordering and review of laboratory studies, ordering and review of radiographic studies, pulse oximetry and re-evaluation of patient's condition.    Allen Young, Delice Bison, DO 08/14/19 [phone]

## 2019-08-04 NOTE — ED Triage Notes (Signed)
28 yo male brought in by [facility] and [employer]. Pt called [employer] while intoxicated. He became combative to the point [employer] called [facility] for assistance. Pt has known history of alcohol intoxication. Pt was given 5mg  haldol IM in right thigh, 2.5 mg of versed IM in right thigh at the scene.   Vitals: bp 110/80 Hr 90 cbg 91 rr 20 spo2 99

## 2019-08-04 NOTE — ED Notes (Signed)
Pt refusing to be stuck for blood work. Pt wanting to leave and walking around room. Pt requesting IV be removed so he can leave. Musician took out IV.  [employer] at bedside as patient is under arrest and will be leaving in their care for outstanding warrants.  Bobby made Dr Leonides Schanz aware and is at bedside.

## 2019-08-04 NOTE — Progress Notes (Signed)
[facility] PROGRESS NOTE  Patient: Yardley Lekas MEQ:[id number]   PCP: Patient, No Pcp Per DOB: 1991-05-01   DOA: 08/04/2019   DOS: 08/04/2019    Subjective: Called by the RN with the patient wants to leave AMA. Evaluated the patient at the bedside.  He mentions that he has some dizziness that he wants to take care of.  Does not want to stay in the hospital.  Objective:  Vitals:   08/04/19 0537 08/04/19 0630  BP: 103/62 115/63  Pulse:  74  Resp:  13  Temp:    SpO2:  98%    Alert awake and oriented to time place and person.  Understand why he is here and what he has been treated for.  Assessment and plan: Acute kidney injury with rhabdomyolysis. Secondary to agitation. Explained to the patient the risk for leaving the hospital before medical treatment is completed including but not limited to worsening of renal failure with eventual hemodialysis and death. Patient understand the risk. Also explained that the patient was outcome after leaving the hospital with most likely be under with police custody and he still wants to leave the hospital. [employer] called. Patient signed AMA papers.  Author: Berle Mull, MD [facility] 08/04/2019 9:52 AM   If 7PM-7AM, please contact night-coverage at [email or web address]

## 2019-08-04 NOTE — ED Notes (Signed)
Patient transported to CT 

## 2019-08-04 NOTE — ED Notes (Signed)
Pt refusing to have labs drawn by this nurse.

## 2019-08-04 NOTE — ED Notes (Signed)
Pt is non-compliant with at this time. Pt not trying to fight/refuse blood being drawn, and threatening staff. Pt repeatedly stating that he needs to leave and to let him go. It was explained to the pt multiple times that he needs to be checked and medically cleared before we can allow him to leave. Pt was asked if he needed to use the bathroom before the catheter was applied. Pt refused. Due to pts condition food and water will not be offered at this time until he calms down more. Pt has begun resting at this time, will assess removal of restraints once there is confidence that the pt is not a flight risk, no longer a danger to staff, and is complient with care.

## 2019-10-05 ENCOUNTER — Other Ambulatory Visit: Payer: Self-pay

## 2019-10-05 ENCOUNTER — Emergency Department (HOSPITAL_COMMUNITY)
Admission: EM | Admit: 2019-10-05 | Discharge: 2019-10-06 | Disposition: A | Discharge status: Home / Self Care | Payer: Self-pay | Attending: Emergency Medicine | Admitting: Emergency Medicine

## 2019-10-05 ENCOUNTER — Encounter (HOSPITAL_COMMUNITY): Payer: Self-pay

## 2019-10-05 DIAGNOSIS — Z20828 Contact with and (suspected) exposure to other viral communicable diseases: Secondary | ICD-10-CM | POA: Insufficient documentation

## 2019-10-05 DIAGNOSIS — F1410 Cocaine abuse, uncomplicated: Secondary | ICD-10-CM

## 2019-10-05 DIAGNOSIS — Y906 Blood alcohol level of 120-199 mg/100 ml: Secondary | ICD-10-CM | POA: Insufficient documentation

## 2019-10-05 DIAGNOSIS — F17200 Nicotine dependence, unspecified, uncomplicated: Secondary | ICD-10-CM | POA: Insufficient documentation

## 2019-10-05 DIAGNOSIS — F10229 Alcohol dependence with intoxication, unspecified: Secondary | ICD-10-CM | POA: Insufficient documentation

## 2019-10-05 DIAGNOSIS — R456 Violent behavior: Secondary | ICD-10-CM | POA: Insufficient documentation

## 2019-10-05 DIAGNOSIS — Z046 Encounter for general psychiatric examination, requested by authority: Secondary | ICD-10-CM | POA: Insufficient documentation

## 2019-10-05 DIAGNOSIS — R45850 Homicidal ideations: Secondary | ICD-10-CM | POA: Insufficient documentation

## 2019-10-05 DIAGNOSIS — F14921 Cocaine use, unspecified with intoxication delirium: Secondary | ICD-10-CM | POA: Insufficient documentation

## 2019-10-05 LAB — CBG MONITORING, ED: Glucose-Capillary: 80 mg/dL (ref 70–99)

## 2019-10-05 MED ORDER — ZIPRASIDONE MESYLATE 20 MG IM SOLR
INTRAMUSCULAR | Status: AC
  Administered 2019-10-05: 15 mg via INTRAMUSCULAR
  Filled 2019-10-05: qty 20

## 2019-10-05 MED ORDER — ZIPRASIDONE MESYLATE 20 MG IM SOLR: 15.0000 mg | Freq: Once | INTRAMUSCULAR | Status: AC

## 2019-10-05 MED ORDER — MIDAZOLAM HCL 2 MG/2ML IJ SOLN
INTRAMUSCULAR | Status: AC
  Administered 2019-10-05: 22:00:00 2 mg
  Filled 2019-10-05: qty 2

## 2019-10-05 MED ORDER — STERILE WATER FOR INJECTION IJ SOLN
INTRAMUSCULAR | Status: AC
  Administered 2019-10-05: 1.2 mL
  Filled 2019-10-05: qty 10

## 2019-10-05 MED ORDER — SODIUM CHLORIDE 0.9 % IV BOLUS
1000.0000 mL | Freq: Once | INTRAVENOUS | Status: AC
  Administered 2019-10-05: 1000 mL via INTRAVENOUS

## 2019-10-05 NOTE — ED Provider Notes (Signed)
[facility]-EMERGENCY DEPT Provider Note   CSN: [id number] Arrival date & time: 10/05/19  2128     History  Homicidal ideation  Allen Young is a 28 y.o. male presenting by EMS in police custody with acute crack cocaine intoxication.  The patient reportedly smoked crack earlier this evening.  He called EMS and was extremely belligerent upon arrival.  He was given 5 mg of IM Versed.  He remains agitated on arrival in the Ed and in restraints.  After being transferred to the bed, the patient did calm down enough to talk to me.  He seems a reasonable exam.  Reports he may be doing drugs earlier today.  It is unclear why he called 911.  The police report that he was belligerent on their arrival and threatened to kill them.  They claim he has a prior history of similar episodes in the setting of drug use.  HPI     History reviewed. No pertinent past medical history.  Patient Active Problem List   Diagnosis Date Noted  . Rhabdomyolysis 08/04/2019  . Alcoholic intoxication with complication (HCC) 08/04/2019  . Hypokalemia 08/04/2019  . Metabolic acidosis 08/04/2019    History reviewed. No pertinent surgical history.     No family history on file.  Social History   Tobacco Use  . Smoking status: Current Every Day Smoker  Substance Use Topics  . Alcohol use: Yes    Comment: 1can beer/day  . Drug use: Yes    Types: Marijuana    Comment: "sometimes"    Home Medications Prior to Admission medications   Not on File    Allergies    Patient has no known allergies.  Review of Systems   Review of Systems  Unable to perform ROS: Psychiatric disorder (level 5 caveat)    Physical Exam Updated Vital Signs BP (!) 142/69 (BP Location: Right Arm)   Pulse 92   Temp 97.8 F (36.6 C) (Oral)   Resp 12   SpO2 100%   Physical Exam Vitals and nursing note reviewed.  Constitutional:      Appearance: He is well-developed.  HENT:     Head: Normocephalic  and atraumatic.  Eyes:     Pupils: Pupils are equal, round, and reactive to light.     Comments: Injected sclera bilaterally  Cardiovascular:     Rate and Rhythm: Regular rhythm. Tachycardia present.     Pulses: Normal pulses.  Pulmonary:     Effort: Pulmonary effort is normal. No respiratory distress.  Abdominal:     Palpations: Abdomen is soft.     Tenderness: There is no abdominal tenderness.  Musculoskeletal:     Cervical back: Neck supple.  Skin:    General: Skin is warm and dry.  Neurological:     General: No focal deficit present.     Mental Status: He is alert and oriented to person, place, and time.     ED Results / Procedures / Treatments   Labs (all labs ordered are listed, but only abnormal results are displayed) Labs Reviewed  CBC WITH DIFFERENTIAL/PLATELET - Abnormal; Notable for the following components:      Result Value   WBC 13.1 (*)    RBC 5.92 (*)    RDW 16.8 (*)    Neutro Abs 10.0 (*)    All other components within normal limits  CK - Abnormal; Notable for the following components:   Total CK 1,399 (*)    All other components within  normal limits  RAPID URINE DRUG SCREEN, HOSP PERFORMED - Abnormal; Notable for the following components:   Cocaine POSITIVE (*)    Tetrahydrocannabinol POSITIVE (*)    All other components within normal limits  COMPREHENSIVE METABOLIC PANEL - Abnormal; Notable for the following components:   Total Protein 8.8 (*)    Albumin 5.3 (*)    All other components within normal limits  SARS CORONAVIRUS 2 (TAT 6-24 HRS)  SALICYLATE LEVEL  ETHANOL  URINALYSIS, ROUTINE W REFLEX MICROSCOPIC  CBG MONITORING, ED    EKG None  Radiology No results found.  Procedures Procedures (including critical care time)  Medications Ordered in ED Medications  sodium chloride 0.9 % bolus 1,000 mL (has no administration in time range)  acetaminophen (TYLENOL) tablet 650 mg (has no administration in time range)  ondansetron (ZOFRAN)  tablet 4 mg (has no administration in time range)  OLANZapine zydis (ZYPREXA) disintegrating tablet 5 mg (has no administration in time range)    And  LORazepam (ATIVAN) tablet 1 mg (has no administration in time range)    And  ziprasidone (GEODON) injection 20 mg (has no administration in time range)  midazolam (VERSED) 2 MG/2ML injection (2 mg  Given 10/05/19 2158)  sodium chloride 0.9 % bolus 1,000 mL (1,000 mLs Intravenous New Bag/Given 10/05/19 2334)  ziprasidone (GEODON) injection 15 mg (15 mg Intramuscular Given 10/05/19 2333)  sterile water (preservative free) injection (1.2 mLs  Given 10/05/19 2330)    ED Course  I have reviewed the triage vital signs and the nursing notes.  Pertinent labs & imaging results that were available during my care of the patient were reviewed by me and considered in my medical decision making (see chart for details).  28 year old male presenting to the emergency department in police custody with acute cocaine induced intoxication and agitation.  Is unclear why the patient called 911 in the first place.  He is focused and conversational with me and able to carry out a care calm conversation.  He is extremely upset about the police, believes that since he is a foreigner, the police are out to make his life living hell.  He believes he had been mistreated by the police and directs all of his anger towards them in the ED.  He denies headache or injury on exam.  No signs of head trauma.  He does appear intoxicated likely from cocaine with mildly pressured speech, but is conversational and oriented otherwise.  He was initiated quite combatative requiring 5 mg IM versed by EMS and 2 mg IV versed on arrival by our staff.  He was placed in 4 point restraints, and then worked them off and ripped out his IV, requiring a dose of 15 mg IM geodon.  He is now sedated.  He is under an IVC from the police department.  We will perform a medical screening and then [facility] is more awake from his sedation.  Clinical Course as of Oct 06 119  Thu Oct 05, 2019  2308 Patient again became acutely agitated and combative.  He was able to pull himself out of his restraints and ripped out his IV.  Is now being held down by staff.  I will give him a dose of 50 mg of IM Geodon.   [MT]  Fri Oct 06, 2019  0108 Pt calm, sleeping.  He has a CK level consistent with MILD rhabdomyolosis, with no hyperkalemia or AKI.  I suspect this is  related to his cocaine drug use and his struggle in custody.  He had a similar elevation on his prior presentation several months ago.  I do not believe this is medically significant to warrant hospitalization, and I believe it is reasonable to have him assessed by our behavioral health team.  He is under IVC by the police department.     [MT]    Clinical Course User Index [MT] Hortencia Martire, Carola Rhine, MD    Final Clinical Impression(s) / ED Diagnoses Final diagnoses:  Cocaine intoxication delirium, acute, hyperactive ([place])  Homicidal ideation    Rx / DC Orders ED Discharge Orders    None       Estanislado Surgeon, Carola Rhine, MD 10/06/19 0120

## 2019-10-05 NOTE — ED Triage Notes (Signed)
Per EMS, patient repeatedly called 911 to order hot wings. On arrival patient became competitive with paramedics and police. Patient endorses use of alcohol and crack cocaine. EMS administered 5mg  versed IM and 318mL bolus NS.

## 2019-10-06 ENCOUNTER — Other Ambulatory Visit: Payer: Self-pay

## 2019-10-06 LAB — COMPREHENSIVE METABOLIC PANEL
ALT: 28 U/L (ref 0–44)
AST: 40 U/L (ref 15–41)
Albumin: 5.3 g/dL — ABNORMAL HIGH (ref 3.5–5.0)
Alkaline Phosphatase: 78 U/L (ref 38–126)
Anion gap: 15 (ref 5–15)
BUN: 9 mg/dL (ref 6–20)
CO2: 22 mmol/L (ref 22–32)
Calcium: 9.3 mg/dL (ref 8.9–10.3)
Chloride: 105 mmol/L (ref 98–111)
Creatinine, Ser: 1.03 mg/dL (ref 0.61–1.24)
GFR calc Af Amer: >60 mL/min (ref >60)
GFR calc non Af Amer: >60 mL/min (ref >60)
Glucose, Bld: 79 mg/dL (ref 70–99)
Potassium: 3.6 mmol/L (ref 3.5–5.1)
Sodium: 142 mmol/L (ref 135–145)
Total Bilirubin: 0.9 mg/dL (ref 0.3–1.2)
Total Protein: 8.8 g/dL — ABNORMAL HIGH (ref 6.5–8.1)

## 2019-10-06 LAB — CBC WITH DIFFERENTIAL/PLATELET
Abs Immature Granulocytes: 0.04 10*3/uL (ref 0.00–0.07)
Basophils Absolute: 0.1 10*3/uL (ref 0.0–0.1)
Basophils Relative: 0 %
Eosinophils Absolute: 0.1 10*3/uL (ref 0.0–0.5)
Eosinophils Relative: 1 %
HCT: 48.5 % (ref 39.0–52.0)
Hemoglobin: 15.6 g/dL (ref 13.0–17.0)
Immature Granulocytes: 0 %
Lymphocytes Relative: 18 %
Lymphs Abs: 2.3 10*3/uL (ref 0.7–4.0)
MCH: 26.4 pg (ref 26.0–34.0)
MCHC: 32.2 g/dL (ref 30.0–36.0)
MCV: 81.9 fL (ref 80.0–100.0)
Monocytes Absolute: 0.6 10*3/uL (ref 0.1–1.0)
Monocytes Relative: 5 %
Neutro Abs: 10 10*3/uL — ABNORMAL HIGH (ref 1.7–7.7)
Neutrophils Relative %: 76 %
Platelets: 304 10*3/uL (ref 150–400)
RBC: 5.92 MIL/uL — ABNORMAL HIGH (ref 4.22–5.81)
RDW: 16.8 % — ABNORMAL HIGH (ref 11.5–15.5)
WBC: 13.1 10*3/uL — ABNORMAL HIGH (ref 4.0–10.5)
nRBC: 0 % (ref 0.0–0.2)

## 2019-10-06 LAB — URINALYSIS, ROUTINE W REFLEX MICROSCOPIC
Bilirubin Urine: NEGATIVE
Glucose, UA: NEGATIVE mg/dL
Hgb urine dipstick: NEGATIVE
Ketones, ur: NEGATIVE mg/dL
Leukocytes,Ua: NEGATIVE
Nitrite: NEGATIVE
Protein, ur: NEGATIVE mg/dL
Specific Gravity, Urine: 1.004 — ABNORMAL LOW (ref 1.005–1.030)
pH: 6 (ref 5.0–8.0)

## 2019-10-06 LAB — RAPID URINE DRUG SCREEN, HOSP PERFORMED
Amphetamines: NOT DETECTED
Barbiturates: NOT DETECTED
Benzodiazepines: NOT DETECTED
Cocaine: POSITIVE — AB
Opiates: NOT DETECTED
Tetrahydrocannabinol: POSITIVE — AB

## 2019-10-06 LAB — SALICYLATE LEVEL: Salicylate Lvl: <7 mg/dL (ref 2.8–30.0)

## 2019-10-06 LAB — SARS CORONAVIRUS 2 (TAT 6-24 HRS): SARS Coronavirus 2: NEGATIVE

## 2019-10-06 LAB — ETHANOL: Alcohol, Ethyl (B): 172 mg/dL — ABNORMAL HIGH (ref <10)

## 2019-10-06 LAB — CK: Total CK: 1399 U/L — ABNORMAL HIGH (ref 49–397)

## 2019-10-06 MED ORDER — OLANZAPINE 5 MG PO TBDP: 5.0000 mg | ORAL_TABLET | Freq: Three times a day (TID) | ORAL | Status: DC | PRN

## 2019-10-06 MED ORDER — SODIUM CHLORIDE 0.9 % IV BOLUS
1000.0000 mL | Freq: Once | INTRAVENOUS | Status: AC
  Administered 2019-10-06: 01:00:00 1000 mL via INTRAVENOUS

## 2019-10-06 MED ORDER — LORAZEPAM 1 MG PO TABS: 1.0000 mg | ORAL_TABLET | ORAL | Status: DC | PRN

## 2019-10-06 MED ORDER — ZIPRASIDONE MESYLATE 20 MG IM SOLR: 20.0000 mg | INTRAMUSCULAR | Status: DC | PRN

## 2019-10-06 MED ORDER — ACETAMINOPHEN 325 MG PO TABS: 650.0000 mg | ORAL_TABLET | ORAL | Status: DC | PRN

## 2019-10-06 MED ORDER — ONDANSETRON HCL 4 MG PO TABS: 4.0000 mg | ORAL_TABLET | Freq: Three times a day (TID) | ORAL | Status: DC | PRN

## 2019-10-06 NOTE — ED Notes (Signed)
Left arm restraint removed. Pt still cooperative when awake but mostly sleeping. Reminded pt of behavior expectations pt nodded in  Acknowledgement

## 2019-10-06 NOTE — ED Notes (Signed)
Patient provided with clothes and flip flops from the ED clothes bin.

## 2019-10-06 NOTE — ED Notes (Addendum)
Left foot restraint removed due to pt being cooperative. Pt voiced understanding as to behavior expectations to get arm restraints removed.

## 2019-10-06 NOTE — ED Notes (Signed)
Patient cooperating for TTS consult.

## 2019-10-06 NOTE — ED Notes (Signed)
Pt still sleeping at this time. 

## 2019-10-06 NOTE — [facility] Counselor (Signed)
TTS unable to complete assessment at this time. Patient given GEODON at 0113. Per pts nurse Lanelle Bal, patient unable to participate at this time. TTS will complete assessment once pt is awake and able to participate.  CLOCK stopped, thanks!

## 2019-10-06 NOTE — ED Notes (Signed)
EDPA informed writer that she told the patient he could sleep until 0800.

## 2019-10-06 NOTE — ED Provider Notes (Signed)
Under IVC for combative, violent behavior, threatening to kill police.   Geodon required to gain control for patient evaluation and patient/staff safety. Pending clearance me TTS evaluation.   Will observe until TTS evaluation can be obtained. Anticipate admission for psychiatric treatment.   The patient found sleeping on multiple re-checks. Wakes to verbal name calling, then goes back to sleep. No restraints needed. Patient is calm.   Per TTS assessment, the patient is cleared from a psychiatric standpoint. Confirmed with TTS counselor, Phineas Real, that the psychiatrist would be rescinding the established IVC.   Patient discharged per psychiatric recommendation.    Charlann Lange, PA-C 10/06/19 0715    Molpus, Jenny Reichmann, MD 10/06/19 1317

## 2019-10-06 NOTE — ED Notes (Signed)
Pt still sleeping at this time pts hr dropping to 60 then right back up to 80-90 PA shari notified.

## 2019-10-06 NOTE — ED Notes (Signed)
Pt given 2 cups of ice water per his request.

## 2019-10-06 NOTE — ED Notes (Signed)
Pt right foot restraint released due to pt being asleep, cooperative.

## 2019-10-06 NOTE — ED Notes (Signed)
Lap belt undone at this time. No restraints remain on pt at this time. Pt has been cooperative and asleep.

## 2019-10-06 NOTE — ED Notes (Signed)
Pt given cup of ice water per request.  
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
## 2019-10-06 NOTE — ED Notes (Signed)
Left arm restraint removed. Pt sleeping at this time.

## 2020-06-13 IMAGING — CT CT CERVICAL SPINE W/O CM
2 of 11 series · 5 of 33 positions shown, 6 images · non-contrast
Comparison: None.

CLINICAL DATA: Headache posttraumatic

EXAM:
CT HEAD WITHOUT CONTRAST
CT CERVICAL SPINE WITHOUT CONTRAST
TECHNIQUE: Multidetector CT imaging of the head and cervical spine was
performed following the standard protocol without intravenous
contrast. Multiplanar CT image reconstructions of the cervical spine
were also generated.

[Series 9: orthogonal bone · axial · 0.19mm/px · z∈[-428,-243]mm · 3 of 96 slices shown, 4 images]
[im 1/96  soft-tissue]
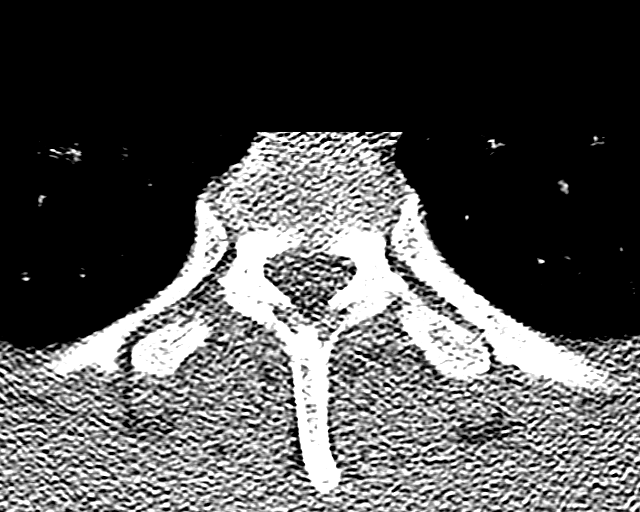
[im 1/96  bone]
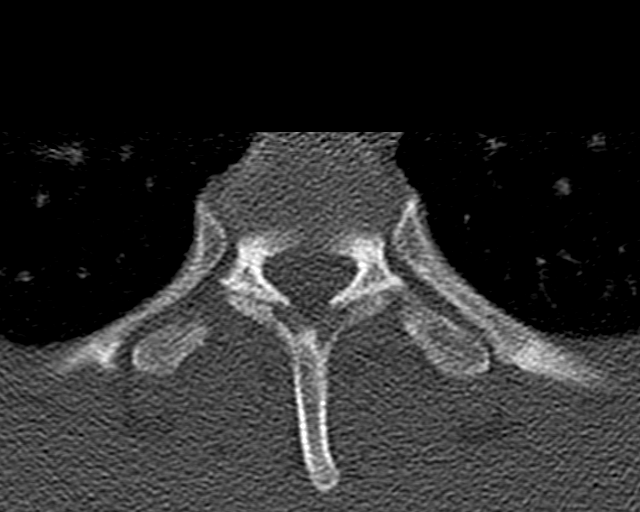
[im 48/96  bone]
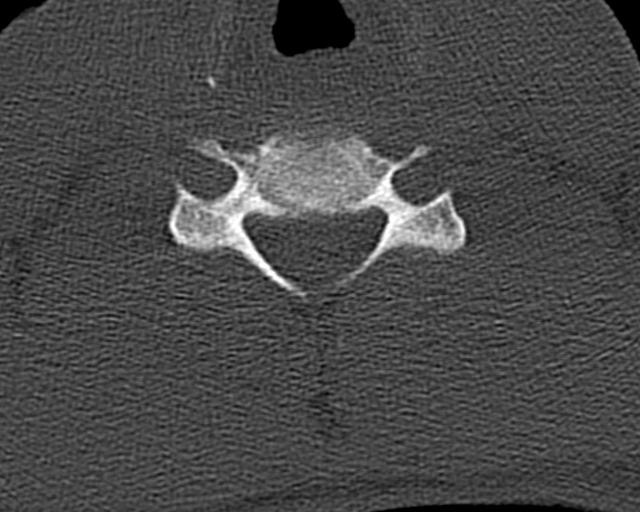
[im 96/96  bone]
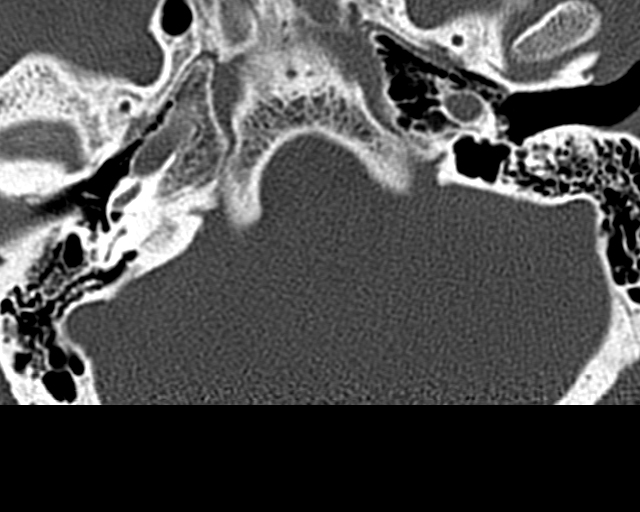

[Series 11: sagittal bone · sagittal · 0.19mm/px · 2 of 54 slices shown]
[im 18/54  bone]
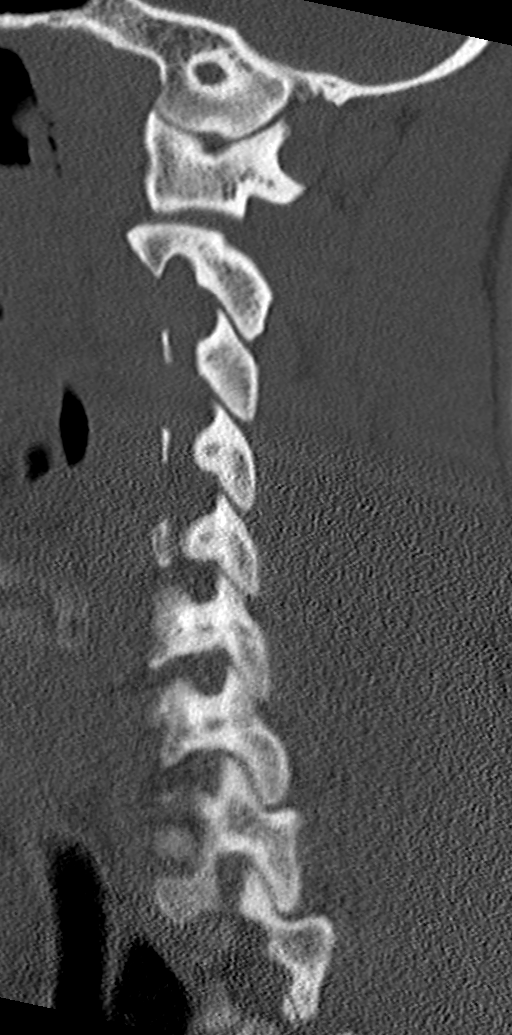
[im 36/54  bone]
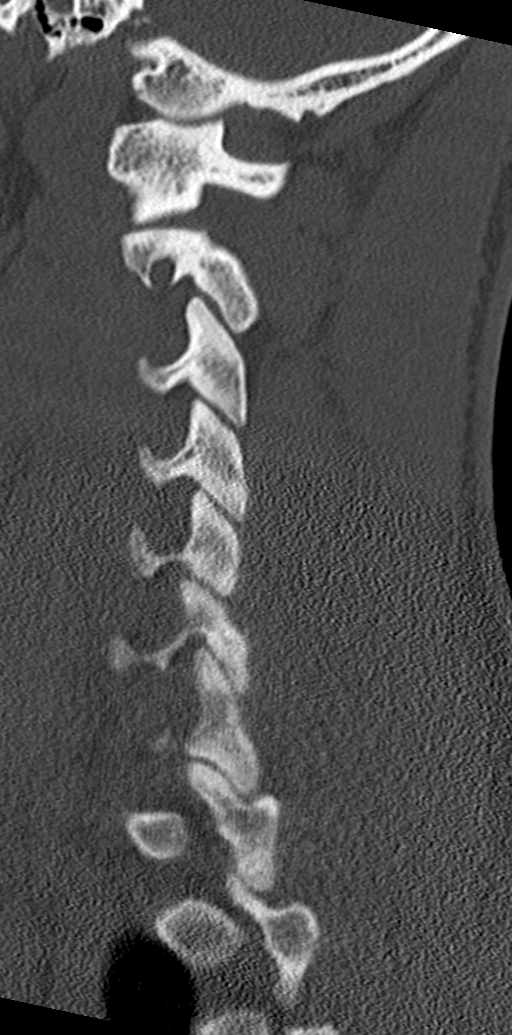

[5 of 33 positions shown; findings below may reference images not displayed]

FINDINGS: CT HEAD FINDINGS

Brain: No evidence of acute infarction, hemorrhage, hydrocephalus,
extra-axial collection or mass lesion/mass effect.

Vascular: No hyperdense vessel or unexpected calcification.

Skull: Normal. Negative for fracture or focal lesion.

Sinuses/Orbits: Mucosal thickening in the maxillary and ethmoid
sinuses

Other: None

CT CERVICAL SPINE FINDINGS

Alignment: Mild straightening. No subluxation. Facet alignment
within normal limits.

Skull base and vertebrae: No acute fracture. No primary bone lesion
or focal pathologic process.

Soft tissues and spinal canal: No prevertebral fluid or swelling. No
visible canal hematoma.

Disc levels:  Mild degenerative changes at C6-C7.

Upper chest: Negative.

Other: None
IMPRESSION: 1. Negative non contrasted CT appearance of the brain.
2. Straightening of the cervical spine. No acute osseous abnormality
